# Patient Record
Sex: Male | Born: 1970 | Race: Black or African American | Hispanic: No | Marital: Married | Smoking: Never smoker
Health system: Southern US, Community
[De-identification: ages and names within clinical notes are randomized; demographics above are authoritative.]

---

## 2016-09-18 ENCOUNTER — Other Ambulatory Visit: Payer: Self-pay | Admitting: General Surgery

## 2016-09-18 DIAGNOSIS — N63 Unspecified lump in unspecified breast: Secondary | ICD-10-CM

## 2016-09-21 ENCOUNTER — Other Ambulatory Visit: Payer: Self-pay | Admitting: General Surgery

## 2016-09-21 DIAGNOSIS — N63 Unspecified lump in unspecified breast: Secondary | ICD-10-CM

## 2019-09-27 ENCOUNTER — Emergency Department (HOSPITAL_COMMUNITY): Payer: Federal, State, Local not specified - PPO

## 2019-09-27 ENCOUNTER — Emergency Department (HOSPITAL_COMMUNITY)
Admission: EM | Admit: 2019-09-27 | Discharge: 2019-09-27 | Disposition: A | Discharge status: Home / Self Care | Payer: Federal, State, Local not specified - PPO | Attending: Emergency Medicine | Admitting: Emergency Medicine

## 2019-09-27 ENCOUNTER — Encounter (HOSPITAL_COMMUNITY): Payer: Self-pay | Admitting: Emergency Medicine

## 2019-09-27 ENCOUNTER — Other Ambulatory Visit: Payer: Self-pay

## 2019-09-27 DIAGNOSIS — Z79899 Other long term (current) drug therapy: Secondary | ICD-10-CM | POA: Insufficient documentation

## 2019-09-27 DIAGNOSIS — N131 Hydronephrosis with ureteral stricture, not elsewhere classified: Secondary | ICD-10-CM | POA: Diagnosis not present

## 2019-09-27 DIAGNOSIS — Z91199 Patient's noncompliance with other medical treatment and regimen due to unspecified reason: Secondary | ICD-10-CM

## 2019-09-27 DIAGNOSIS — I10 Essential (primary) hypertension: Secondary | ICD-10-CM | POA: Diagnosis not present

## 2019-09-27 DIAGNOSIS — R1031 Right lower quadrant pain: Secondary | ICD-10-CM | POA: Diagnosis present

## 2019-09-27 DIAGNOSIS — N201 Calculus of ureter: Secondary | ICD-10-CM

## 2019-09-27 LAB — URINALYSIS, ROUTINE W REFLEX MICROSCOPIC
Bacteria, UA: NONE SEEN
Bilirubin Urine: NEGATIVE
Glucose, UA: 50 mg/dL — AB
Ketones, ur: NEGATIVE mg/dL
Leukocytes,Ua: NEGATIVE
Nitrite: NEGATIVE
Protein, ur: NEGATIVE mg/dL
Specific Gravity, Urine: 1.014 (ref 1.005–1.030)
pH: 7 (ref 5.0–8.0)

## 2019-09-27 LAB — BASIC METABOLIC PANEL
Anion gap: 11 (ref 5–15)
BUN: 12 mg/dL (ref 6–20)
CO2: 20 mmol/L — ABNORMAL LOW (ref 22–32)
Calcium: 9.4 mg/dL (ref 8.9–10.3)
Chloride: 106 mmol/L (ref 98–111)
Creatinine, Ser: 1.42 mg/dL — ABNORMAL HIGH (ref 0.61–1.24)
GFR calc Af Amer: >60 mL/min (ref >60)
GFR calc non Af Amer: 58 mL/min — ABNORMAL LOW (ref >60)
Glucose, Bld: 121 mg/dL — ABNORMAL HIGH (ref 70–99)
Potassium: 3.8 mmol/L (ref 3.5–5.1)
Sodium: 137 mmol/L (ref 135–145)

## 2019-09-27 LAB — CBC WITH DIFFERENTIAL/PLATELET
Abs Immature Granulocytes: 0.02 10*3/uL (ref 0.00–0.07)
Basophils Absolute: 0 10*3/uL (ref 0.0–0.1)
Basophils Relative: 0 %
Eosinophils Absolute: 0 10*3/uL (ref 0.0–0.5)
Eosinophils Relative: 0 %
HCT: 47.7 % (ref 39.0–52.0)
Hemoglobin: 15.7 g/dL (ref 13.0–17.0)
Immature Granulocytes: 0 %
Lymphocytes Relative: 14 %
Lymphs Abs: 1.4 10*3/uL (ref 0.7–4.0)
MCH: 29.6 pg (ref 26.0–34.0)
MCHC: 32.9 g/dL (ref 30.0–36.0)
MCV: 89.8 fL (ref 80.0–100.0)
Monocytes Absolute: 0.8 10*3/uL (ref 0.1–1.0)
Monocytes Relative: 8 %
Neutro Abs: 7.6 10*3/uL (ref 1.7–7.7)
Neutrophils Relative %: 78 %
Platelets: 257 10*3/uL (ref 150–400)
RBC: 5.31 MIL/uL (ref 4.22–5.81)
RDW: 12.5 % (ref 11.5–15.5)
WBC: 9.9 10*3/uL (ref 4.0–10.5)
nRBC: 0 % (ref 0.0–0.2)

## 2019-09-27 MED ORDER — OXYCODONE HCL 5 MG PO TABS: 5.0000 mg | ORAL_TABLET | Freq: Four times a day (QID) | ORAL | 0 refills | Status: AC | PRN

## 2019-09-27 MED ORDER — AMLODIPINE BESYLATE 5 MG PO TABS
5.0000 mg | ORAL_TABLET | Freq: Once | ORAL | Status: AC
  Administered 2019-09-27: 5 mg via ORAL
  Filled 2019-09-27: qty 1

## 2019-09-27 MED ORDER — AMLODIPINE BESYLATE 5 MG PO TABS: 5.0000 mg | ORAL_TABLET | Freq: Every day | ORAL | 0 refills | Status: AC

## 2019-09-27 MED ORDER — MORPHINE SULFATE (PF) 4 MG/ML IV SOLN
4.0000 mg | Freq: Once | INTRAVENOUS | Status: AC
  Administered 2019-09-27: 4 mg via INTRAVENOUS
  Filled 2019-09-27: qty 1

## 2019-09-27 MED ORDER — ONDANSETRON HCL 4 MG PO TABS
4.0000 mg | ORAL_TABLET | Freq: Three times a day (TID) | ORAL | 0 refills | Status: AC | PRN
Start: 2019-09-27 — End: ?

## 2019-09-27 MED ORDER — KETOROLAC TROMETHAMINE 30 MG/ML IJ SOLN: 30.0000 mg | Freq: Once | INTRAMUSCULAR | Status: DC

## 2019-09-27 MED ORDER — HYDROMORPHONE HCL 1 MG/ML IJ SOLN
1.0000 mg | Freq: Once | INTRAMUSCULAR | Status: AC
  Administered 2019-09-27: 1 mg via INTRAVENOUS
  Filled 2019-09-27: qty 1

## 2019-09-27 MED ORDER — TAMSULOSIN HCL 0.4 MG PO CAPS: 0.4000 mg | ORAL_CAPSULE | Freq: Every day | ORAL | 0 refills | Status: AC

## 2019-09-27 NOTE — ED Provider Notes (Signed)
[facility] Provider Note   CSN: [id number] Arrival date & time: 09/27/19  1002     History Chief Complaint  Patient presents with  . Back Pain    Keith Reid is a 49 y.o. male with past medical history of hypertension presents to the ER for evaluation of sudden onset, moderate to severe right-sided flank pain that woke him up from his sleep around 5 AM.  Nonradiating.  Nothing made it better or worse.  No changes with movement.  No associated chest pain, shortness of breath.  Has noted associated subjective hot flashes, nausea and vomiting x1.  Normal urination but states he feels like he has to have a bowel movement but he can have a "full 1".  Every time he strains to have a bowel movement he urinates a little bit.  He had a small bowel movement prior to arrival.  He took aspirin and it felt like the pain subsided only slightly.  Constantly feels uncomfortable, pain is a 9/10.  Denies any fall, injury or exercise.  No falls.  No heavy lifting.  No recent changes in how he slept.  No history of kidney stones.  No associated dysuria, hematuria.  No associated abdominal pain, groin pain or testicular pain.  No saddle anesthesia, extremity weakness or numbness.  He has no pleuritic pain in this area, chest pain, shortness of breath or cough.  Of note he has history of hypertension but states he has not taken blood pressure medicines in almost 8 years because his PCP moved.  Denies any severe headache, vision changes, chest pain, shortness of breath, extremity swelling, recent orthopnea. HPI     History reviewed. No pertinent past medical history.  There are no problems to display for this patient.   History reviewed. No pertinent surgical history.     No family history on file.  Social History   Tobacco Use  . Smoking status: Never Smoker  . Smokeless tobacco: Never Used  Substance Use Topics  . Alcohol use: Not Currently  . Drug use: Not  Currently    Home Medications Prior to Admission medications   Medication Sig Start Date End Date Taking? Authorizing Provider  amLODipine (NORVASC) 5 MG tablet Take 1 tablet (5 mg total) by mouth daily. 09/27/19 10/27/19  Kinnie Feil, PA-C  ondansetron (ZOFRAN) 4 MG tablet Take 1 tablet (4 mg total) by mouth every 8 (eight) hours as needed for nausea or vomiting. 09/27/19   Kinnie Feil, PA-C  oxyCODONE (OXY IR/ROXICODONE) 5 MG immediate release tablet Take 1 tablet (5 mg total) by mouth every 6 (six) hours as needed for up to 3 days for severe pain. 09/27/19 09/30/19  Kinnie Feil, PA-C  tamsulosin (FLOMAX) 0.4 MG CAPS capsule Take 1 capsule (0.4 mg total) by mouth daily for 15 days. 09/27/19 10/12/19  Kinnie Feil, PA-C    Allergies    Patient has no allergy information on record.  Review of Systems   Review of Systems  Constitutional: Positive for diaphoresis.  Genitourinary: Positive for flank pain.  All other systems reviewed and are negative.   Physical Exam Updated Vital Signs BP (!) 199/125 (BP Location: Right Arm) Comment: PA aware  Pulse 81   Temp 98.9 F (37.2 C) (Oral)   Resp 18   Ht 5\' 2"  (1.575 m)   Wt 108.9 kg   SpO2 100%   BMI 43.90 kg/m   Physical Exam Vitals and nursing note reviewed.  Constitutional:      Appearance: He is well-developed.     Comments: Non toxic.  HENT:     Head: Normocephalic and atraumatic.     Nose: Nose normal.  Eyes:     Conjunctiva/sclera: Conjunctivae normal.  Cardiovascular:     Rate and Rhythm: Normal rate and regular rhythm.     Heart sounds: Normal heart sounds.     Comments: 1+ radial and DP pulses bilaterally.  No lower extremity edema.  No murmurs. Pulmonary:     Effort: Pulmonary effort is normal.     Breath sounds: Normal breath sounds.  Abdominal:     General: Bowel sounds are normal.     Palpations: Abdomen is soft.     Tenderness: There is no abdominal tenderness.     Comments: Patient  appears uncomfortable but in no significant distress.  Unable to reproduce worsening tenderness with palpation of the right thoracic muscles, CVA percussion.  No abdominal tenderness.  No suprapubic tenderness.  Musculoskeletal:        General: Normal range of motion.     Cervical back: Normal range of motion.     Comments: TL spine: No midline or paraspinal muscle tenderness.  Normal skin over the back.  Skin:    General: Skin is warm and dry.     Capillary Refill: Capillary refill takes less than 2 seconds.  Neurological:     Mental Status: He is alert.     Comments: Sensation and strength intact in upper and lower extremities.  Patient ambulatory to the bathroom without difficulty.  Psychiatric:        Behavior: Behavior normal.     ED Results / Procedures / Treatments   Labs (all labs ordered are listed, but only abnormal results are displayed) Labs Reviewed  URINALYSIS, ROUTINE W REFLEX MICROSCOPIC - Abnormal; Notable for the following components:      Result Value   Color, Urine STRAW (*)    Glucose, UA 50 (*)    Hgb urine dipstick SMALL (*)    All other components within normal limits  BASIC METABOLIC PANEL - Abnormal; Notable for the following components:   CO2 20 (*)    Glucose, Bld 121 (*)    Creatinine, Ser 1.42 (*)    GFR calc non Af Amer 58 (*)    All other components within normal limits  CBC WITH DIFFERENTIAL/PLATELET    EKG None  Radiology CT Renal Stone Study  Addendum Date: 09/27/2019   ADDENDUM REPORT: 09/27/2019 15:23 ADDENDUM: Of note, the obstructing right distal ureteral calculus measures 2 mm in size. Electronically Signed   By: Romona Curls M.D.   On: 09/27/2019 15:23   Result Date: 09/27/2019 CLINICAL DATA:  Hematuria and right flank pain. EXAM: CT ABDOMEN AND PELVIS WITHOUT CONTRAST TECHNIQUE: Multidetector CT imaging of the abdomen and pelvis was performed following the standard protocol without IV contrast. COMPARISON:  None. FINDINGS: Lower  chest: No acute abnormality. Hepatobiliary: Two subcentimeter cysts measure up to 2.1 seconds. No gallstones, gallbladder wall thickening, or biliary dilatation. Pancreas: Unremarkable. No pancreatic ductal dilatation or surrounding inflammatory changes. Spleen: Normal in size without focal abnormality. Adrenals/Urinary Tract: Adrenal glands are unremarkable. An obstructing distal ureteral calculus on the right resulting in mild right hydroureteronephrosis and periureteral/perinephric fat stranding. The left kidney appears normal, without renal calculi, focal lesion, or hydronephrosis. Bladder is unremarkable. Stomach/Bowel: Stomach is within normal limits. Appendix appears normal. No evidence of bowel wall thickening, distention, or inflammatory changes. Vascular/Lymphatic:  No significant vascular findings are present. No enlarged abdominal or pelvic lymph nodes. Reproductive: Prostate is unremarkable. Other: No abdominal wall hernia or abnormality. No abdominopelvic ascites. Musculoskeletal: No acute or significant osseous findings. IMPRESSION: An obstructing distal ureteral calculus on the right resulting in mild right hydroureteronephrosis and periureteral/perinephric fat stranding. Electronically Signed: By: Romona Curls M.D. On: 09/27/2019 13:08    Procedures Procedures (including critical care time)  Medications Ordered in ED Medications  morphine 4 MG/ML injection 4 mg (4 mg Intravenous Given 09/27/19 1236)  HYDROmorphone (DILAUDID) injection 1 mg (1 mg Intravenous Given 09/27/19 1432)  amLODipine (NORVASC) tablet 5 mg (5 mg Oral Given 09/27/19 1540)    ED Course  I have reviewed the triage vital signs and the nursing notes.  Pertinent labs & imaging results that were available during my care of the patient were reviewed by me and considered in my medical decision making (see chart for details).  Clinical Course as of Sep 26 1544  Sat Sep 27, 2019  1158 Hgb urine dipstick(!): SMALL [CG]    1159 RBC / HPF: 6-10 [CG]  1354 IMPRESSION: An obstructing distal ureteral calculus on the right resulting in mild right hydroureteronephrosis and periureteral/perinephric fat stranding.  CT Renal Stone Study [CG]  1355 Creatinine(!): 1.42 [CG]  1355 GFR, Est African American: >60 [CG]  1527 I called radiologist to confirm size of the stone, states the stone is 2 mm  CT Renal Sound[place] [CG]    Clinical Course User Index [CG] Keith Handy, PA-C   MDM Rules/Calculators/A&P                          This patient complains of right flank pain, severe with nausea, vomiting.  No red flag symptoms like fever, dysuria, chest pain, shortness of breath, extremity numbness or weakness or tingling.  Noted to be hypertensive here in the ER in setting of previous diagnosis of high blood pressure, noncompliant for 8 years with medicines.  Obtained additional history from previous medical records available.  Reviewed nursing notes to help with MDM.  Based on history, exam high suspicion for GU process like renal stone, pyelonephritis, MSK etiology.  He has very high blood pressure here but symptoms and exam not consistent with dissection, AAA.  No associated chest pain, shortness of breath, pleuritic pain.  I ordered labs including CBC, BMP, urinalysis.  I ordered CT renal.  I ordered medicines to control pain including morphine, Dilaudid.  ER work-up personally visualized, interpreted by me.  Urinalysis reveals RBCs but no signs of infection.  Creatinine mildly elevated at 1.4, unknown baseline.  CT renal shows 2 mm obstructing stone in the distal ureter.  This fits clinical picture.  Patient has been reevaluated, adequate control of flank pain.  Discussed patient's high blood pressure.  He denies symptoms to suggest hypertensive crisis like severe headache, stroke symptoms, chest pain, shortness of breath, peripheral edema.  No indication to treat essentially asymptomatic hypertension  other than with oral amlodipine here.  Patient has been voiding urine without difficulty.  Appropriate for discharge treatment for, amlodipine.  Discussed with patient and wife symptoms that would warrant immediate return to the ER for hypertensive emergency.  They are in agreement.  Encouraged patient to establish care with PCP for ongoing management of blood pressure and any medication adjustment needed.  Discussed with EDP.   Final Clinical Impression(s) / ED Diagnoses Final diagnoses:  Right ureteral stone  Elevated  blood pressure reading with diagnosis of hypertension  Medical non-compliance    Rx / DC Orders ED Discharge Orders         Ordered    amLODipine (NORVASC) 5 MG tablet  Daily     Discontinue  Reprint     09/27/19 1531    tamsulosin (FLOMAX) 0.4 MG CAPS capsule  Daily     Discontinue  Reprint     09/27/19 1531    ondansetron (ZOFRAN) 4 MG tablet  Every 8 hours PRN     Discontinue  Reprint     09/27/19 1531    oxyCODONE (OXY IR/ROXICODONE) 5 MG immediate release tablet  Every 6 hours PRN     Discontinue  Reprint     09/27/19 1531           Keith Handy, PA-C 09/27/19 1546    Keith Nick, MD 09/28/19 [phone]

## 2019-09-27 NOTE — Discharge Instructions (Addendum)
You were seen in the ER for right flank pain  You have a kidney stone.  It is 2 mm.  Your kidney function is only slightly elevated.  This could be from the passing stone or from many years of not treating your blood pressure  You were prescribed ondansetron for nausea to take as needed.  You were prescribed tamsulosin to help dilate the ureter, take this daily.  Alternate 500 to 1000 mg of acetaminophen every 6-8 hours for mild to moderate pain on your flank.  For breakthrough or severe pain take oxycodone 5 mg.  Your blood pressure was elevated today but you denied any symptoms to suggest high blood pressure crisis.  Please start taking amlodipine 5 mg every day.  Try to keep track of your blood pressure daily.  You need to establish care with a primary care doctor for ongoing management of your blood pressure.  Return to the ER for sudden onset severe headache, vision changes, stroke symptoms, chest pain, shortness of breath, swelling in your legs  It may take several days for you to pass a stone, you may have lingering milder pain in the flank until you do so.  Follow-up with urology in the next 7 to 10 days if you continue to have pain.

## 2019-09-27 NOTE — ED Triage Notes (Signed)
Pt. Stated, I started having back pain with chills this morning.

## 2019-09-27 NOTE — ED Notes (Signed)
Patient given discharge instructions. Questions were answered. Patient verbalized understanding of discharge instructions and care at home.  

## 2021-01-15 IMAGING — CT CT RENAL STONE PROTOCOL
2 of 4 series · 15 of 46 positions shown, 17 images · non-contrast
Comparison: None.
COMPARISON: None.

Addendum:
CLINICAL DATA: Hematuria and right flank pain.

EXAM:
CT ABDOMEN AND PELVIS WITHOUT CONTRAST
TECHNIQUE: Multidetector CT imaging of the abdomen and pelvis was performed
following the standard protocol without IV contrast.

[Series 3: renal stone 5.0 · axial · 0.81mm/px · z∈[-515,-60]mm · 12 of 101 slices shown, 14 images]
[im 5/101  soft-tissue]
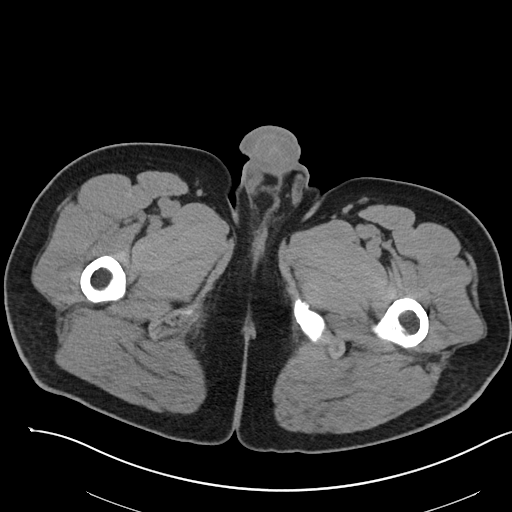
[im 5/101  bone]
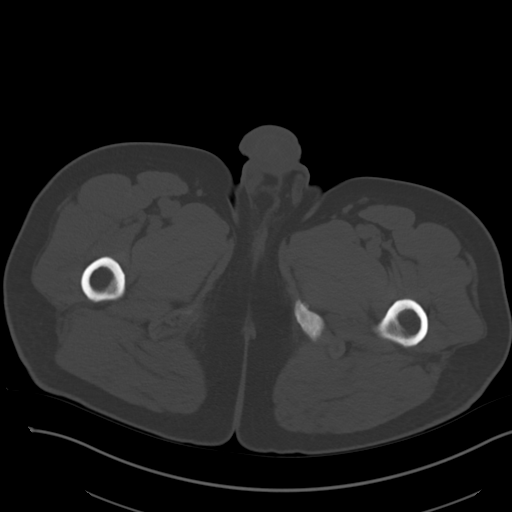
[im 13/101  soft-tissue]
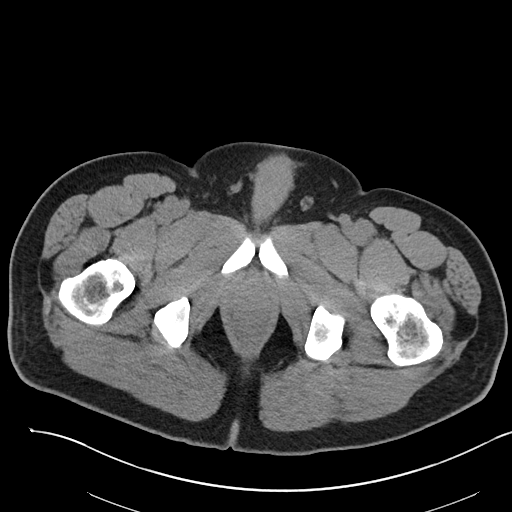
[im 21/101  soft-tissue]
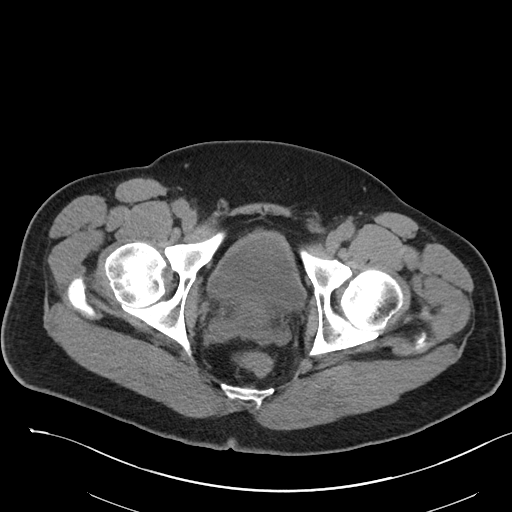
[im 30/101  soft-tissue]
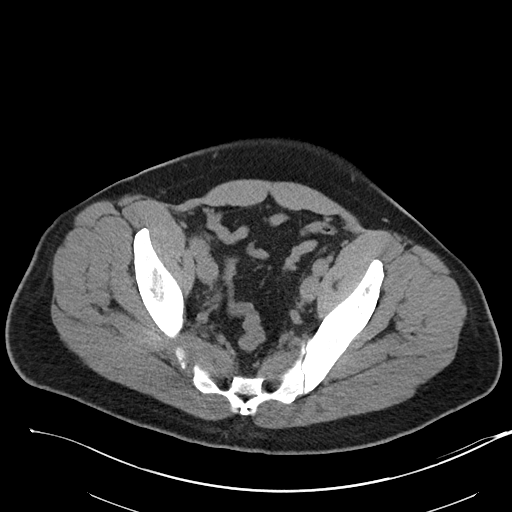
[im 38/101  soft-tissue]
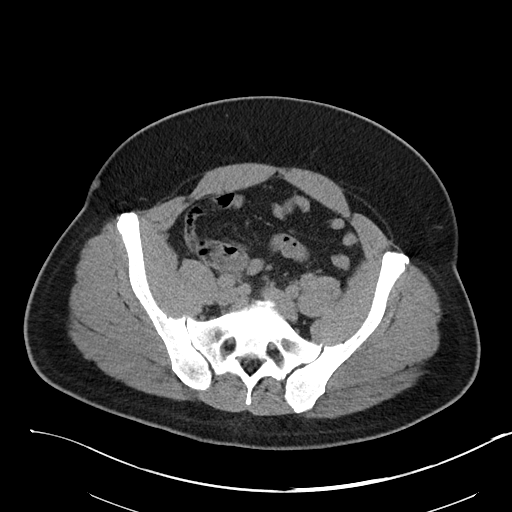
[im 46/101  soft-tissue]
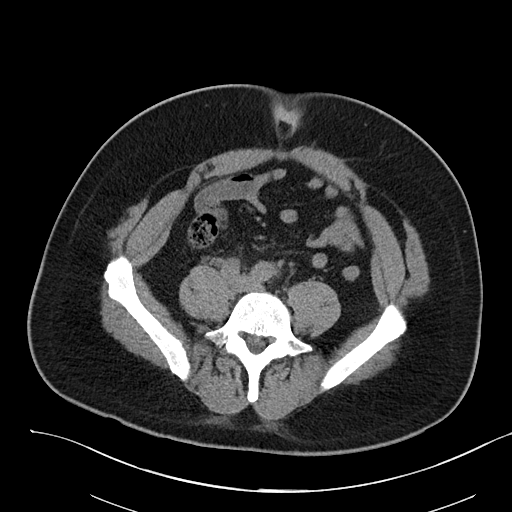
[im 55/101  soft-tissue]
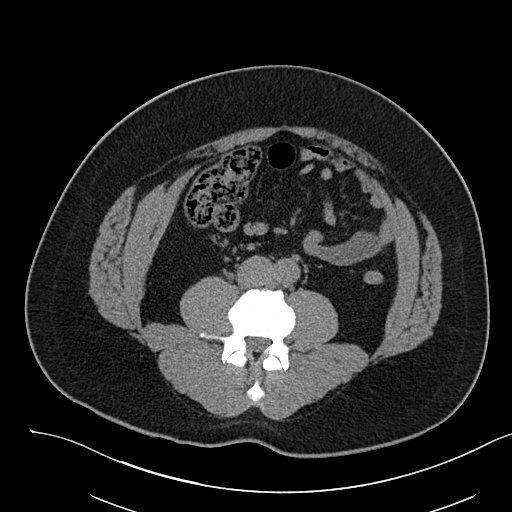
[im 63/101  soft-tissue]
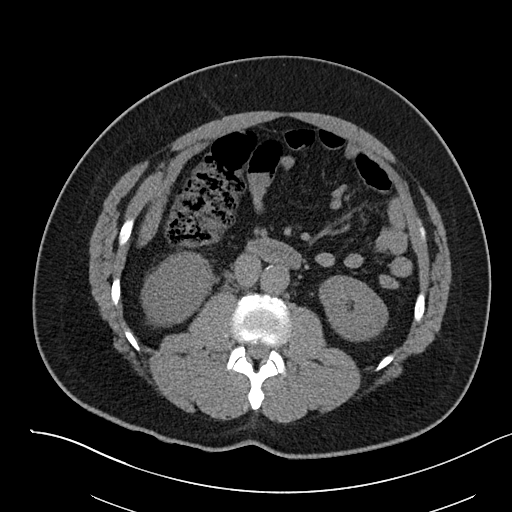
[im 71/101  soft-tissue]
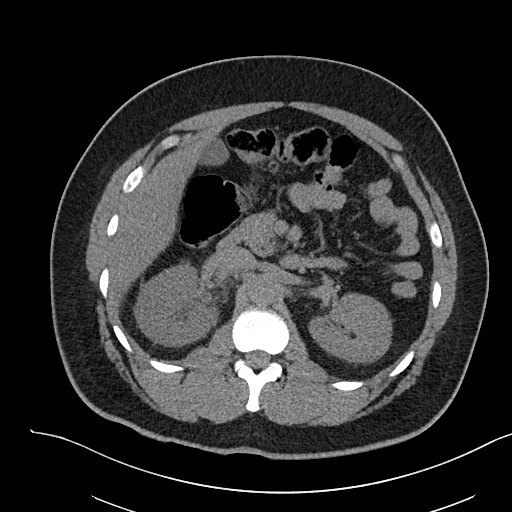
[im 71/101  bone]
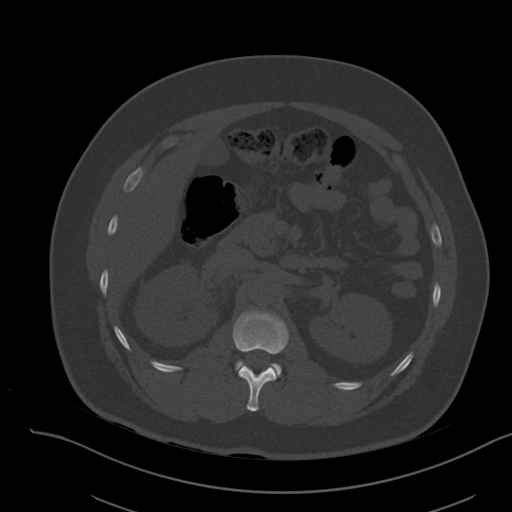
[im 80/101  soft-tissue]
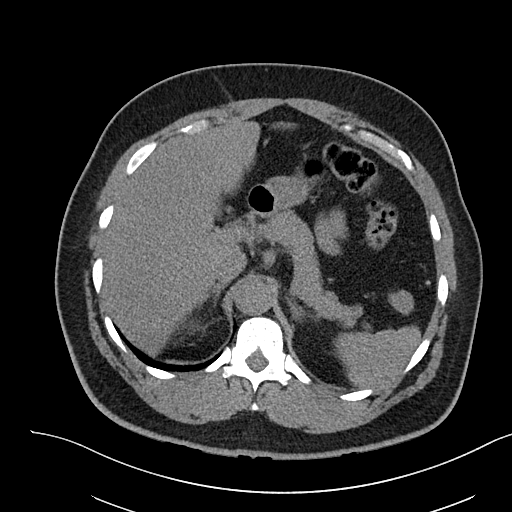
[im 88/101  soft-tissue]
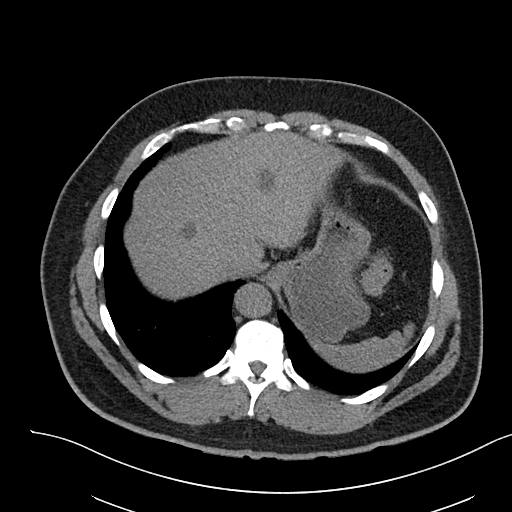
[im 96/101  soft-tissue]
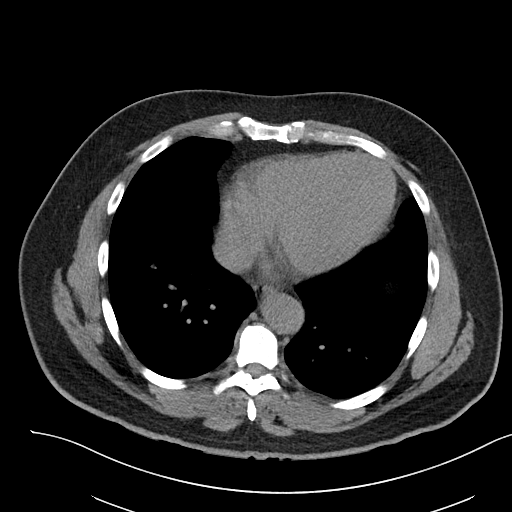

[Series 6: coronal · coronal · 0.72mm/px · 3 of 101 slices shown]
[im 34/101  soft-tissue]
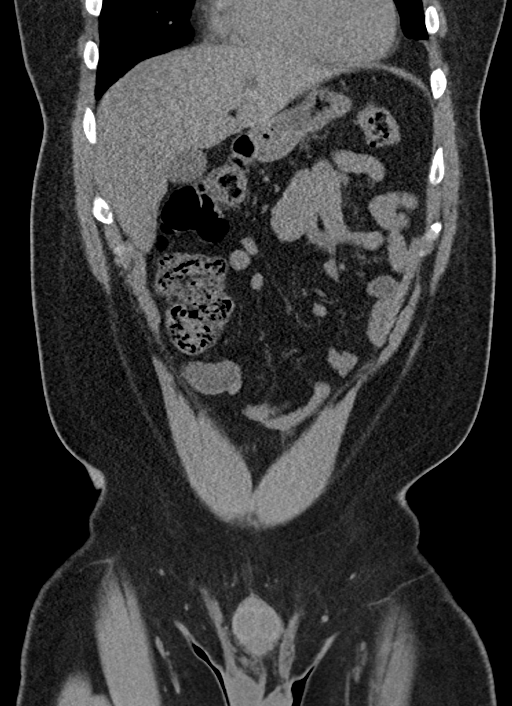
[im 45/101  soft-tissue]
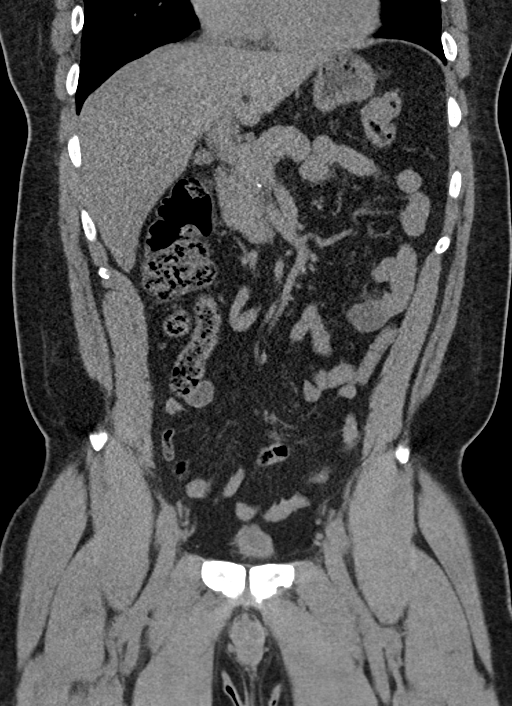
[im 56/101  soft-tissue]
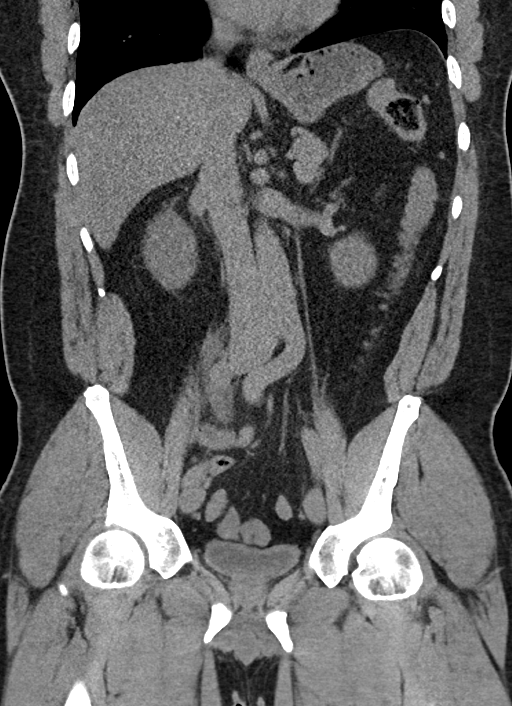

[15 of 46 positions shown; findings below may reference images not displayed]

FINDINGS: Lower chest: No acute abnormality.

Hepatobiliary: Two subcentimeter cysts measure up to 2.1 seconds. No
gallstones, gallbladder wall thickening, or biliary dilatation.

Pancreas: Unremarkable. No pancreatic ductal dilatation or
surrounding inflammatory changes.

Spleen: Normal in size without focal abnormality.

Adrenals/Urinary Tract: Adrenal glands are unremarkable. An
obstructing distal ureteral calculus on the right resulting in mild
right hydroureteronephrosis and periureteral/perinephric fat
stranding. The left kidney appears normal, without renal calculi,
focal lesion, or hydronephrosis. Bladder is unremarkable.

Stomach/Bowel: Stomach is within normal limits. Appendix appears
normal. No evidence of bowel wall thickening, distention, or
inflammatory changes.

Vascular/Lymphatic: No significant vascular findings are present. No
enlarged abdominal or pelvic lymph nodes.

Reproductive: Prostate is unremarkable.

Other: No abdominal wall hernia or abnormality. No abdominopelvic
ascites.

Musculoskeletal: No acute or significant osseous findings.
IMPRESSION: An obstructing distal ureteral calculus on the right resulting in
mild right hydroureteronephrosis and periureteral/perinephric fat
stranding.

ADDENDUM:
Of note, the obstructing right distal ureteral calculus measures 2
mm in size.

*** End of Addendum ***
FINDINGS: Lower chest: No acute abnormality.

Hepatobiliary: Two subcentimeter cysts measure up to 2.1 seconds. No
gallstones, gallbladder wall thickening, or biliary dilatation.

Pancreas: Unremarkable. No pancreatic ductal dilatation or
surrounding inflammatory changes.

Spleen: Normal in size without focal abnormality.

Adrenals/Urinary Tract: Adrenal glands are unremarkable. An
obstructing distal ureteral calculus on the right resulting in mild
right hydroureteronephrosis and periureteral/perinephric fat
stranding. The left kidney appears normal, without renal calculi,
focal lesion, or hydronephrosis. Bladder is unremarkable.

Stomach/Bowel: Stomach is within normal limits. Appendix appears
normal. No evidence of bowel wall thickening, distention, or
inflammatory changes.

Vascular/Lymphatic: No significant vascular findings are present. No
enlarged abdominal or pelvic lymph nodes.

Reproductive: Prostate is unremarkable.

Other: No abdominal wall hernia or abnormality. No abdominopelvic
ascites.

Musculoskeletal: No acute or significant osseous findings.
IMPRESSION: An obstructing distal ureteral calculus on the right resulting in
mild right hydroureteronephrosis and periureteral/perinephric fat
stranding.
